# Patient Record
Sex: Female | Born: 1960 | Race: White | Hispanic: No | Marital: Married | Smoking: Never smoker
Health system: Southern US, Community
[De-identification: ages and names within clinical notes are randomized; demographics above are authoritative.]

## PROBLEM LIST (undated history)

## (undated) DIAGNOSIS — G43909 Migraine, unspecified, not intractable, without status migrainosus: Secondary | ICD-10-CM

## (undated) DIAGNOSIS — B2790 Infectious mononucleosis, unspecified without complication: Secondary | ICD-10-CM

## (undated) DIAGNOSIS — IMO0002 Reserved for concepts with insufficient information to code with codable children: Secondary | ICD-10-CM

## (undated) DIAGNOSIS — A499 Bacterial infection, unspecified: Secondary | ICD-10-CM

## (undated) DIAGNOSIS — E611 Iron deficiency: Secondary | ICD-10-CM

## (undated) DIAGNOSIS — I2510 Atherosclerotic heart disease of native coronary artery without angina pectoris: Secondary | ICD-10-CM

## (undated) DIAGNOSIS — B379 Candidiasis, unspecified: Secondary | ICD-10-CM

## (undated) DIAGNOSIS — Z8619 Personal history of other infectious and parasitic diseases: Secondary | ICD-10-CM

## (undated) DIAGNOSIS — Z79899 Other long term (current) drug therapy: Secondary | ICD-10-CM

## (undated) DIAGNOSIS — R079 Chest pain, unspecified: Secondary | ICD-10-CM

## (undated) DIAGNOSIS — I200 Unstable angina: Secondary | ICD-10-CM

## (undated) HISTORY — DX: Candidiasis, unspecified: B37.9

## (undated) HISTORY — PX: DILATION AND CURETTAGE OF UTERUS: SHX78

## (undated) HISTORY — DX: Bacterial infection, unspecified: A49.9

## (undated) HISTORY — DX: Personal history of other infectious and parasitic diseases: Z86.19

## (undated) HISTORY — DX: Other long term (current) drug therapy: Z79.899

## (undated) HISTORY — PX: TONSILLECTOMY: SUR1361

## (undated) HISTORY — DX: Migraine, unspecified, not intractable, without status migrainosus: G43.909

## (undated) HISTORY — DX: Atherosclerotic heart disease of native coronary artery without angina pectoris: I25.10

## (undated) HISTORY — DX: Reserved for concepts with insufficient information to code with codable children: IMO0002

## (undated) HISTORY — PX: WISDOM TOOTH EXTRACTION: SHX21

## (undated) HISTORY — DX: Unstable angina: I20.0

## (undated) HISTORY — DX: Infectious mononucleosis, unspecified without complication: B27.90

## (undated) HISTORY — DX: Iron deficiency: E61.1

## (undated) HISTORY — DX: Chest pain, unspecified: R07.9

## (undated) HISTORY — PX: APPENDECTOMY: SHX54

---

## 1996-09-04 DIAGNOSIS — R87619 Unspecified abnormal cytological findings in specimens from cervix uteri: Secondary | ICD-10-CM

## 1996-09-04 DIAGNOSIS — IMO0002 Reserved for concepts with insufficient information to code with codable children: Secondary | ICD-10-CM

## 1996-09-04 HISTORY — DX: Reserved for concepts with insufficient information to code with codable children: IMO0002

## 1996-09-04 HISTORY — DX: Unspecified abnormal cytological findings in specimens from cervix uteri: R87.619

## 1997-09-04 HISTORY — PX: ABDOMINAL HYSTERECTOMY: SHX81

## 2006-08-30 ENCOUNTER — Ambulatory Visit (HOSPITAL_COMMUNITY): Admission: RE | Admit: 2006-08-30 | Discharge: 2006-08-30 | Payer: Self-pay | Admitting: Obstetrics and Gynecology

## 2006-09-13 ENCOUNTER — Encounter: Admission: RE | Admit: 2006-09-13 | Discharge: 2006-09-13 | Payer: Self-pay | Admitting: Obstetrics and Gynecology

## 2007-09-03 ENCOUNTER — Ambulatory Visit (HOSPITAL_COMMUNITY): Admission: RE | Admit: 2007-09-03 | Discharge: 2007-09-03 | Payer: Self-pay | Admitting: Obstetrics and Gynecology

## 2009-01-14 ENCOUNTER — Encounter: Admission: RE | Admit: 2009-01-14 | Discharge: 2009-01-14 | Payer: Self-pay | Admitting: Obstetrics and Gynecology

## 2010-01-18 ENCOUNTER — Ambulatory Visit (HOSPITAL_COMMUNITY): Admission: RE | Admit: 2010-01-18 | Discharge: 2010-01-18 | Payer: Self-pay | Admitting: Obstetrics and Gynecology

## 2010-12-27 DIAGNOSIS — I200 Unstable angina: Secondary | ICD-10-CM

## 2010-12-27 DIAGNOSIS — R079 Chest pain, unspecified: Secondary | ICD-10-CM

## 2010-12-28 ENCOUNTER — Inpatient Hospital Stay: Admit: 2010-12-28 | Payer: Self-pay | Source: Other Acute Inpatient Hospital | Admitting: Cardiology

## 2010-12-28 ENCOUNTER — Ambulatory Visit (HOSPITAL_COMMUNITY)
Admission: AD | Admit: 2010-12-28 | Discharge: 2010-12-28 | Disposition: A | Discharge status: Home / Self Care | Payer: Managed Care, Other (non HMO) | Source: Other Acute Inpatient Hospital | Attending: Cardiovascular Disease | Admitting: Cardiovascular Disease

## 2010-12-28 DIAGNOSIS — R0789 Other chest pain: Secondary | ICD-10-CM | POA: Insufficient documentation

## 2010-12-28 DIAGNOSIS — R079 Chest pain, unspecified: Secondary | ICD-10-CM

## 2010-12-29 ENCOUNTER — Telehealth: Payer: Self-pay | Admitting: *Deleted

## 2010-12-29 DIAGNOSIS — I2510 Atherosclerotic heart disease of native coronary artery without angina pectoris: Secondary | ICD-10-CM

## 2010-12-29 HISTORY — DX: Atherosclerotic heart disease of native coronary artery without angina pectoris: I25.10

## 2011-01-02 NOTE — Telephone Encounter (Signed)
She can stop taking Aspirin

## 2011-01-03 NOTE — Telephone Encounter (Signed)
Patient informed of the below via message machine.

## 2011-01-04 NOTE — Discharge Summary (Addendum)
Valerie Waters, Valerie Waters                ACCOUNT NO.:  [id number]  MEDICAL RECORD NO.:  [id number]           PATIENT TYPE:  O  LOCATION:  [facility]                         FACILITY:  [facility]  PHYSICIAN:  Lorine Bears, MD     DATE OF BIRTH:  09/14/1960  DATE OF ADMISSION:  12/28/2010 DATE OF DISCHARGE:  12/28/2010                              DISCHARGE SUMMARY   DISCHARGE DIAGNOSES: 1. Chest pain.     a.     Third troponin came back 0.13 at [facility], unclear      significance, question coronary spasm.     b.     Normal coronary arteries by cath December 28, 2010. 2. History of migraine headaches. 3. LVEF of 60-65% with mild tricuspid regurgitation, otherwise normal     echocardiogram December 27, 2010, at [facility].  HOSPITAL COURSE:  Valerie Waters is a pleasant 50 year old female with a history of migraine headaches, who works as a Engineer, civil (consulting), who presented to [facility] with complaints of chest pain, starting about 6 days ago, left sided, aching, pressure-like sensation, which is most of the time continuous.  In between, she gets sharp chest pain, which is brief and lasts for a few seconds.  It does not get worse with activities and is not worse with any food or position.  Cardiac enzymes were negative x2, and the third set came back with a troponin of 0.13.  She was chest pain free at the time evaluation.  EKG showed normal sinus rhythm with no significant ST or T changes.  D-dimer was elevated at 1.54.  A CT of the chest was done reportedly at [facility], which was showed no evidence of pulmonary embolism.  Her chest pain was felt to be atypical; however, there was concern that women sometimes present in an atypical fashion.  Given her positive troponin, cardiac catheterization was recommended.  She was transferred to [facility] this morning for this procedure that was done by Dr. Kirke Corin which showed normal coronary arteries and normal LVEDP.  Please see above  for echo results that was previously done at [facility].  Dr. Kirke Corin felt there was unclear etiology of elevated troponin, possible coronary spasm.  I discussed this with Dr. Excell Seltzer and we have elected to discontinue her Imitrex and Treximet.  Dr. Kirke Corin has seen and examined her today and feels she is stable for discharge.  DISCHARGE LABS:  Discussed above.  Per report, CBC and BMP at [facility] were unremarkable.  STUDIES: 1. Cardiac catheterization December 28, 2010, please see full report for     details. 2. Two-D echocardiogram December 27, 2010, normal EF of 60-65% with mild     TR, trace pulmonic regurgitation, otherwise normal. 3. CTA of the chest, reportedly at [facility] showing no     evidence of pulmonary embolism.  DISCHARGE MEDICATIONS:  Amitriptyline 20 mg nightly.  Her Treximet and Imitrex have been discontinued.  DISPOSITION:  Valerie Waters will be discharged in stable condition to home. She is instructed to increase activity slowly, not to lift anything for 1 week or participate in such activity for  1 week.  She is not to drive for 2 days.  She may return to work on January 02, 2011.  She is to follow a heart-healthy diet and to call or return if she notices any pain, swelling, bleeding, or pus at the cath site.  She will follow up with Dr. Kirke Corin in approximately 4 weeks in our office.  We will call her with this appointment at [facility] office.  Please note, the patient does not have an inpatient encounter locked in the computer and therefore she was manually given a copy of discharge medication list, which included continuing her amitriptyline.  DURATION OF DISCHARGE ENCOUNTER:  Greater than 30 minutes including physician and PA time.     Ronie Spies, P.A.C.   ______________________________ Lorine Bears, MD    DD/MEDQ  D:  12/28/2010  T:  12/29/2010  Job:  [id number]  Electronically Signed by Lorine Bears MD on 01/04/2011 02:43:49 PM Electronically  Signed by Ronie Spies  on 01/05/2011 02:10:18 PM

## 2011-01-04 NOTE — Cardiovascular Report (Signed)
[name], Valerie Waters NO.:  [id number]  MEDICAL RECORD NO.:  [id number]          PATIENT TYPE:  LOCATION:                                 FACILITY:  PHYSICIAN:  Lorine Bears, MD     DATE OF BIRTH:  1961/06/22  DATE OF PROCEDURE: DATE OF DISCHARGE:                           CARDIAC CATHETERIZATION   REFERRING CARDIOLOGIST:  Learta Codding, MD,FACC.  PROCEDURES PERFORMED: 1. Left heart catheterization 2. Coronary angiography.  INDICATIONS AND CLINICAL HISTORY:  This is a 50 year old female with no previous cardiac history or risk factors for coronary artery disease. She has history of migraine headache.  She presented with 6 days history of atypical chest pain.  She was evaluated at [facility]  She underwent CT scan of her chest, which showed no evidence of pulmonary embolism.  Her initial cardiac enzymes came back negative.  However, the third troponin was 0.13, which was verified after it was repeated. Interestingly, the patient had a migraine headache early morning before her troponin was drawn, which could have been precipitated by nitroglycerin.  Due to the patient's symptoms that was suggestive of unstable angina with mildly elevated troponin level, cardiac catheterization was recommended.  Risks, benefits, and alternatives were discussed with the patient.  She did have an echocardiogram done, which overall was unremarkable with normal LV systolic function and no significant valvular abnormalities.  ACCESS:  Right radial artery.  STUDY DETAILS:  A standard informed consent was obtained.  The right radial area was prepped in a sterile fashion.  It was anesthetized with 1% lidocaine.  A 3 mg of verapamil was given through the sheath.  A 3000 units of unfractionated heparin was given intravenously.  I attempted to perform coronary angiography with a Jacky catheter, but was not able to cannulate coronary arteries due to small size aortic root.   I was able to cross the aortic valve.  We then recorded left ventricular pressure with pullback.  No left ventricular angiography was performed given that she had an echocardiogram done yesterday.  Left main coronary artery was engaged with a JL-3.5 catheter.  Right coronary artery was engaged with JR-4 catheter.  All catheter exchanges were done over the wire.  The sheath was removed at the end of the case.  A TR band was applied. There was no immediate complications.  STUDY FINDINGS:  Hemodynamic findings:  Left ventricular pressure is 92/0 with a left ventricular end-diastolic pressure of 4 mmHg.  Aortic pressure is 96/57 with a mean pressure of 74 mmHg.  No significant gradient was noted across the aortic valve.  Left ventricular angiography was not performed given that she had an echocardiogram done yesterday with normal ejection fraction.  Coronary angiography: 1. Left main coronary artery:  The vessel was normal in size and free     of significant disease. 2. Left anterior descending artery:  The vessel was normal in size and     free of significant disease.  There are three medium-sized diagonal     branches which are free of significant disease. 3. Left circumflex artery:  The vessel was normal in  size and     nondominant.  There is no significant disease noted in the vessel. 4. Right coronary artery:  The vessel is a large size and dominant.     It has no significant atherosclerosis or obstructive disease.  The     right PDA is normal in size and free of significant disease.  There     are two posterolateral branches which are free of significant     disease.  STUDY CONCLUSION: 1. Normal coronary arteries. 2. Normal LVEDP. 3. Unclear etiology for slight elevation in cardiac troponin-I.  A CT     scan was already done and showed no evidence of pulmonary embolism.     There is the possibility of coronary spasm which can be noted     sometimes in patients with migraine  headache.  RECOMMENDATIONS:  No specific intervention is needed at this time. Continue with lifestyle changes.  If there are any recurrent symptoms of chest pain, we will consider treatment with a calcium channel blocker or a long-acting nitrate.     Lorine Bears, MD     MA/MEDQ  D:  12/28/2010  T:  12/29/2010  Job:  [id number]  cc:   Learta Codding, MD,FACC  Electronically Signed by Lorine Bears MD on 01/04/2011 02:44:36 PM

## 2011-01-24 ENCOUNTER — Encounter: Payer: Managed Care, Other (non HMO) | Admitting: Cardiology

## 2011-01-24 ENCOUNTER — Encounter: Payer: Self-pay | Admitting: Cardiology

## 2011-03-02 ENCOUNTER — Other Ambulatory Visit (HOSPITAL_COMMUNITY): Payer: Self-pay | Admitting: Obstetrics and Gynecology

## 2011-03-02 DIAGNOSIS — Z1231 Encounter for screening mammogram for malignant neoplasm of breast: Secondary | ICD-10-CM

## 2011-03-15 ENCOUNTER — Ambulatory Visit (HOSPITAL_COMMUNITY)
Admission: RE | Admit: 2011-03-15 | Discharge: 2011-03-15 | Disposition: A | Discharge status: Home / Self Care | Payer: Managed Care, Other (non HMO) | Source: Ambulatory Visit | Attending: Obstetrics and Gynecology | Admitting: Obstetrics and Gynecology

## 2011-03-15 DIAGNOSIS — Z1231 Encounter for screening mammogram for malignant neoplasm of breast: Secondary | ICD-10-CM

## 2011-05-25 ENCOUNTER — Other Ambulatory Visit: Payer: Self-pay | Admitting: Chiropractic Medicine

## 2011-05-25 ENCOUNTER — Ambulatory Visit
Admission: RE | Admit: 2011-05-25 | Discharge: 2011-05-25 | Disposition: A | Discharge status: Home / Self Care | Payer: Managed Care, Other (non HMO) | Source: Ambulatory Visit | Attending: Chiropractic Medicine | Admitting: Chiropractic Medicine

## 2011-05-25 DIAGNOSIS — M5412 Radiculopathy, cervical region: Secondary | ICD-10-CM

## 2011-05-25 DIAGNOSIS — M545 Low back pain: Secondary | ICD-10-CM

## 2012-01-04 ENCOUNTER — Other Ambulatory Visit: Payer: Self-pay | Admitting: Obstetrics and Gynecology

## 2012-01-04 DIAGNOSIS — Z1231 Encounter for screening mammogram for malignant neoplasm of breast: Secondary | ICD-10-CM

## 2012-03-04 ENCOUNTER — Encounter: Payer: Self-pay | Admitting: Obstetrics and Gynecology

## 2012-03-04 ENCOUNTER — Other Ambulatory Visit: Payer: Private Health Insurance - Indemnity

## 2012-03-04 ENCOUNTER — Ambulatory Visit (INDEPENDENT_AMBULATORY_CARE_PROVIDER_SITE_OTHER): Payer: Private Health Insurance - Indemnity | Admitting: Obstetrics and Gynecology

## 2012-03-04 ENCOUNTER — Ambulatory Visit (INDEPENDENT_AMBULATORY_CARE_PROVIDER_SITE_OTHER): Payer: Private Health Insurance - Indemnity

## 2012-03-04 VITALS — BP 102/64 | Ht 65.0 in | Wt 148.0 lb

## 2012-03-04 DIAGNOSIS — Z01419 Encounter for gynecological examination (general) (routine) without abnormal findings: Secondary | ICD-10-CM

## 2012-03-04 DIAGNOSIS — M899 Disorder of bone, unspecified: Secondary | ICD-10-CM

## 2012-03-04 DIAGNOSIS — M8580 Other specified disorders of bone density and structure, unspecified site: Secondary | ICD-10-CM

## 2012-03-04 NOTE — Progress Notes (Signed)
Last Pap: 2007 WNL: Yes Regular Periods:no Contraception: Hysterectomy   Monthly Breast exam:yes Tetanus<72yrs:yes Nl.Bladder Function:yes Daily BMs:yes Healthy Diet:yes Calcium:yes Mammogram:yes Date of Mammogram: 03/2012  Exercise:yes Have often Exercise: daily  Seatbelt: yes Abuse at home: no Stressful work:no Sigmoid-colonoscopy: 2010 WNL  Bone Density:   03/04/2012    Osteopenia of spine with T score of -1.4    FRAX nl    Last Vit D on 06/03/10= 40 PCP: Dr. Lorelei Pont Change in PMH: None Change in ZOX:WRUE  Subjective:    Valerie Waters is a 51 y.o. female G4P0010 who presents for annual exam.  The patient has no complaints today.   The following portions of the patient's history were reviewed and updated as appropriate: allergies, current medications, past family history, past medical history, past social history, past surgical history and problem list.  Review of Systems Pertinent items are noted in HPI. Gastrointestinal:No change in bowel habits, no abdominal pain, no rectal bleeding Genitourinary:negative for dysuria, frequency, hematuria, nocturia and urinary incontinence    Objective:     BP 102/64  Ht 5\' 5"  (1.651 m)  Wt 148 lb (67.132 kg)  BMI 24.63 kg/m2  Weight:  Wt Readings from Last 1 Encounters:  03/04/12 148 lb (67.132 kg)     BMI: Body mass index is 24.63 kg/(m^2). General Appearance: Alert, appropriate appearance for age. No acute distress HEENT: Grossly normal Neck / Thyroid: Supple, no masses, nodes or enlargement Lungs: clear to auscultation bilaterally Back: No CVA tenderness Breast Exam: No masses or nodes.No dimpling, nipple retraction or discharge. Cardiovascular: Regular rate and rhythm. S1, S2, no murmur Gastrointestinal: Soft, non-tender, no masses or organomegaly Pelvic Exam: External genitalia: normal general appearance Vaginal: atrophic mucosa and vaginal vault, well suspended  Cervix: removed surgically Adnexa: non  palpable Uterus: removed surgically Rectovaginal: normal rectal, no masses Lymphatic Exam: Non-palpable nodes in neck, clavicular, axillary, or inguinal regions Skin: no rash or abnormalities Neurologic: Normal gait and speech, no tremor  Psychiatric: Alert and oriented, appropriate affect.    Urinalysis:Not done      Assessment:    Nl post hysterectomy exam  Osteopenia with nl FRAX and good bone maintenance regimen   Plan:    Discussed healthy lifestyle modifications. May consider rpt DXA in 2 or more years   Follow-up:  for annual exam

## 2012-03-20 ENCOUNTER — Ambulatory Visit (HOSPITAL_COMMUNITY)
Admission: RE | Admit: 2012-03-20 | Discharge: 2012-03-20 | Disposition: A | Discharge status: Home / Self Care | Payer: Managed Care, Other (non HMO) | Source: Ambulatory Visit | Attending: Obstetrics and Gynecology | Admitting: Obstetrics and Gynecology

## 2012-03-20 DIAGNOSIS — Z1231 Encounter for screening mammogram for malignant neoplasm of breast: Secondary | ICD-10-CM

## 2012-03-28 ENCOUNTER — Encounter: Payer: Self-pay | Admitting: Obstetrics and Gynecology

## 2013-02-11 ENCOUNTER — Other Ambulatory Visit: Payer: Self-pay | Admitting: Obstetrics and Gynecology

## 2013-02-11 DIAGNOSIS — Z1231 Encounter for screening mammogram for malignant neoplasm of breast: Secondary | ICD-10-CM

## 2013-02-24 ENCOUNTER — Ambulatory Visit (HOSPITAL_COMMUNITY)
Admission: RE | Admit: 2013-02-24 | Discharge: 2013-02-24 | Disposition: A | Discharge status: Home / Self Care | Payer: Managed Care, Other (non HMO) | Source: Ambulatory Visit | Attending: Obstetrics and Gynecology | Admitting: Obstetrics and Gynecology

## 2013-02-24 DIAGNOSIS — Z1231 Encounter for screening mammogram for malignant neoplasm of breast: Secondary | ICD-10-CM | POA: Insufficient documentation

## 2014-01-29 ENCOUNTER — Other Ambulatory Visit: Payer: Self-pay | Admitting: Obstetrics and Gynecology

## 2014-01-29 DIAGNOSIS — Z1231 Encounter for screening mammogram for malignant neoplasm of breast: Secondary | ICD-10-CM

## 2014-02-16 ENCOUNTER — Ambulatory Visit (HOSPITAL_COMMUNITY)
Admission: RE | Admit: 2014-02-16 | Discharge: 2014-02-16 | Disposition: A | Discharge status: Home / Self Care | Payer: Managed Care, Other (non HMO) | Source: Ambulatory Visit | Attending: Obstetrics and Gynecology | Admitting: Obstetrics and Gynecology

## 2014-02-16 DIAGNOSIS — Z1231 Encounter for screening mammogram for malignant neoplasm of breast: Secondary | ICD-10-CM | POA: Insufficient documentation

## 2014-07-06 ENCOUNTER — Encounter: Payer: Self-pay | Admitting: Obstetrics and Gynecology

## 2015-05-30 ENCOUNTER — Encounter (HOSPITAL_COMMUNITY): Payer: Self-pay | Admitting: Emergency Medicine

## 2015-05-30 ENCOUNTER — Emergency Department (HOSPITAL_COMMUNITY): Payer: Managed Care, Other (non HMO)

## 2015-05-30 ENCOUNTER — Emergency Department (HOSPITAL_COMMUNITY)
Admission: EM | Admit: 2015-05-30 | Discharge: 2015-05-31 | Disposition: A | Discharge status: Home / Self Care | Payer: Managed Care, Other (non HMO) | Attending: Emergency Medicine | Admitting: Emergency Medicine

## 2015-05-30 DIAGNOSIS — R509 Fever, unspecified: Secondary | ICD-10-CM | POA: Diagnosis present

## 2015-05-30 DIAGNOSIS — R059 Cough, unspecified: Secondary | ICD-10-CM

## 2015-05-30 DIAGNOSIS — B349 Viral infection, unspecified: Secondary | ICD-10-CM | POA: Insufficient documentation

## 2015-05-30 DIAGNOSIS — R05 Cough: Secondary | ICD-10-CM

## 2015-05-30 DIAGNOSIS — Z8619 Personal history of other infectious and parasitic diseases: Secondary | ICD-10-CM | POA: Insufficient documentation

## 2015-05-30 DIAGNOSIS — I2510 Atherosclerotic heart disease of native coronary artery without angina pectoris: Secondary | ICD-10-CM | POA: Diagnosis not present

## 2015-05-30 LAB — I-STAT TROPONIN, ED: TROPONIN I, POC: 0 ng/mL (ref 0.00–0.08)

## 2015-05-30 LAB — CBC
HCT: 41 % (ref 36.0–46.0)
Hemoglobin: 13.7 g/dL (ref 12.0–15.0)
MCH: 29.4 pg (ref 26.0–34.0)
MCHC: 33.4 g/dL (ref 30.0–36.0)
MCV: 88 fL (ref 78.0–100.0)
PLATELETS: 192 10*3/uL (ref 150–400)
RBC: 4.66 MIL/uL (ref 3.87–5.11)
RDW: 13.2 % (ref 11.5–15.5)
WBC: 2.7 10*3/uL — AB (ref 4.0–10.5)

## 2015-05-30 LAB — BASIC METABOLIC PANEL
Anion gap: 8 (ref 5–15)
BUN: 14 mg/dL (ref 6–20)
CO2: 24 mmol/L (ref 22–32)
CREATININE: 0.85 mg/dL (ref 0.44–1.00)
Calcium: 9.4 mg/dL (ref 8.9–10.3)
Chloride: 104 mmol/L (ref 101–111)
Glucose, Bld: 148 mg/dL — ABNORMAL HIGH (ref 65–99)
Potassium: 3.7 mmol/L (ref 3.5–5.1)
SODIUM: 136 mmol/L (ref 135–145)

## 2015-05-30 LAB — I-STAT CG4 LACTIC ACID, ED: LACTIC ACID, VENOUS: 1.19 mmol/L (ref 0.5–2.0)

## 2015-05-30 MED ORDER — METOCLOPRAMIDE HCL 5 MG/ML IJ SOLN
10.0000 mg | Freq: Once | INTRAMUSCULAR | Status: AC
  Administered 2015-05-30: 10 mg via INTRAVENOUS
  Filled 2015-05-30: qty 2

## 2015-05-30 MED ORDER — ACETAMINOPHEN 325 MG PO TABS
650.0000 mg | ORAL_TABLET | Freq: Once | ORAL | Status: AC
  Administered 2015-05-30: 650 mg via ORAL
  Filled 2015-05-30: qty 2

## 2015-05-30 MED ORDER — DOXYCYCLINE HYCLATE 100 MG PO CAPS: 100.0000 mg | ORAL_CAPSULE | Freq: Two times a day (BID) | ORAL | Status: DC

## 2015-05-30 MED ORDER — SODIUM CHLORIDE 0.9 % IV BOLUS (SEPSIS)
1000.0000 mL | Freq: Once | INTRAVENOUS | Status: AC
  Administered 2015-05-30: 1000 mL via INTRAVENOUS

## 2015-05-30 MED ORDER — DOXYCYCLINE HYCLATE 100 MG PO TABS
100.0000 mg | ORAL_TABLET | Freq: Once | ORAL | Status: AC
  Administered 2015-05-30: 100 mg via ORAL
  Filled 2015-05-30: qty 1

## 2015-05-30 MED ORDER — DIPHENHYDRAMINE HCL 50 MG/ML IJ SOLN
25.0000 mg | Freq: Once | INTRAMUSCULAR | Status: AC
  Administered 2015-05-30: 25 mg via INTRAVENOUS
  Filled 2015-05-30: qty 1

## 2015-05-30 NOTE — ED Notes (Signed)
Feeling better.

## 2015-05-30 NOTE — ED Notes (Signed)
The pt  Has multiple complaints.   Chest pain for 10 days sob since Friday.  None now dry cough. Fever chills since Saturday  Elevated temp today with a headache.  Insect bite rt forearm with redness  2 days ago  Small red area rt forearm  Appears to be a bug bite.  Tem today noi fever reducer taken

## 2015-05-30 NOTE — ED Provider Notes (Signed)
CSN: [id number]     Arrival date & time 05/30/15  1944 History   First MD Initiated Contact with Patient 05/30/15 2044     Chief Complaint  Patient presents with  . Fever  . Cough  . Chest Pain     (Consider location/radiation/quality/duration/timing/severity/associated sxs/prior Treatment) Patient is a 54 y.o. female presenting with cough.  Cough Cough characteristics:  Non-productive Severity:  Moderate Onset quality:  Gradual Duration:  2 days Timing:  Constant Progression:  Unchanged Chronicity:  New Smoker: no   Context comment:  Travelled via plane to [facility] Relieved by:  Nothing Worsened by:  Nothing tried Associated symptoms: fever (tmax 102), myalgias and sinus congestion   Associated symptoms: no shortness of breath     Past Medical History  Diagnosis Date  . Chest pain, unspecified   . Coronary atherosclerosis of native coronary artery 12/29/2010       CARDIAC CATHETERIZATION   . Intermediate coronary syndrome   . Encounter for long-term (current) use of other medications   . H/O varicella   . Infectious mononucleosis   . Abnormal Pap smear 1998  . Yeast infection   . Bacterial infection   . Low iron   . Migraine, unspecified, without mention of intractable migraine without mention of status migrainosus     History of   Past Surgical History  Procedure Laterality Date  . Wisdom tooth extraction    . Tonsillectomy    . Appendectomy    . Dilation and curettage of uterus    . Abdominal hysterectomy  1999   Family History  Problem Relation Age of Onset  . Cancer Mother   . Mental illness Brother    Social History  Substance Use Topics  . Smoking status: Never Smoker   . Smokeless tobacco: Never Used  . Alcohol Use: No   OB History    Gravida Para Term Preterm AB TAB SAB Ectopic Multiple Living   Review of Systems  Constitutional: Positive for fever (tmax 102).  Respiratory: Positive for cough. Negative for shortness of  breath.   Musculoskeletal: Positive for myalgias.  All other systems reviewed and are negative.     Allergies  Triptans; Zomig; Codeine; and Demerol  Home Medications   Prior to Admission medications   Medication Sig Start Date End Date Taking? Authorizing Provider  doxycycline (VIBRAMYCIN) 100 MG capsule Take 1 capsule (100 mg total) by mouth 2 (two) times daily. One po bid x 20 days 05/30/15   Mirian Mo, MD   BP 118/74 mmHg  Pulse 77  Temp(Src) 98.7 F (37.1 C) (Oral)  Resp 16  Ht  (1.626 m)  Wt 134 lb (60.782 kg)  BMI 22.99 kg/m2  SpO2 97% Physical Exam  Constitutional: She is oriented to person, place, and time. She appears well-developed and well-nourished.  HENT:  Head: Normocephalic and atraumatic.  Right Ear: External ear normal.  Left Ear: External ear normal.  Eyes: Conjunctivae and EOM are normal. Pupils are equal, round, and reactive to light.  Neck: Normal range of motion. Neck supple.  Cardiovascular: Normal rate, regular rhythm, normal heart sounds and intact distal pulses.   Pulmonary/Chest: Effort normal and breath sounds normal.  Abdominal: Soft. Bowel sounds are normal. There is generalized tenderness (mild).  Musculoskeletal: Normal range of motion.  Neurological: She is alert and oriented to person, place, and time.  Skin: Skin is warm and dry.  Vitals reviewed.   ED Course  Procedures (including critical care time) Labs Review Labs Reviewed  BASIC METABOLIC PANEL - Abnormal; Notable for the following:    Glucose, Bld 148 (*)    All other components within normal limits  CBC - Abnormal; Notable for the following:    WBC 2.7 (*)    All other components within normal limits  URINE CULTURE  URINALYSIS, ROUTINE W REFLEX MICROSCOPIC (NOT AT [facility])  I-STAT CG4 LACTIC ACID, ED  Rosezena Sensor, ED    Imaging Review Dg Chest 2 View  05/30/2015   CLINICAL DATA:  Fever for 2 days with cough, chest pain and headache.  EXAM: CHEST  2 VIEW   COMPARISON:  Single view of the chest and CT chest 12/26/2010.  FINDINGS: Heart size and mediastinal contours are within normal limits. Both lungs are clear. Visualized skeletal structures are unremarkable.  IMPRESSION: Negative exam.   Electronically Signed   By: Drusilla Kanner M.D.   On: 05/30/2015 20:47   I have personally reviewed and evaluated these images and lab results as part of my medical decision-making.   EKG Interpretation   Date/Time:  Sunday May 30 2015 19:49:20 EDT Ventricular Rate:  102 PR Interval:  130 QRS Duration: 88 QT Interval:  422 QTC Calculation: 550 R Axis:   86 Text Interpretation:  Sinus tachycardia Low voltage QRS Nonspecific T wave  abnormality Prolonged QT Abnormal ECG No old tracing to compare Confirmed  by Mirian Mo [phone]) on 05/30/2015 9:04:52 PM      MDM   Final diagnoses:  Viral syndrome  Cough    54 y.o. female with pertinent PMH of cad presents with cough, fever, myalgias in setting of recent airline travel.  Pt has been using essential oils at home without relief.  On arrival today vitals signs and physical exam as above. No significant chest pain.  Patient afebrile initially, however then developed a fever to 100.7.  She is also concerned about being bit by a tick and questionably by black widow. She did not see a spider bite her, however cleared "10" likely is out of the arm. She's had no abdominal pain. She has diffuse myalgias. Workup as above demonstrated no acute pathology with the exception of a mild leukopenia.  Likely viral syndrome, however due to recent tick bite will treat empirically for tickborne illness with doxycycline.  DC home in stable condition.    I have reviewed all laboratory and imaging studies if ordered as above  1. Viral syndrome   2. Cough         Mirian Mo, MD 05/31/15 [phone]

## 2015-05-30 NOTE — Discharge Instructions (Signed)
Upper Respiratory Infection, Adult An upper respiratory infection (URI) is also sometimes known as the common cold. The upper respiratory tract includes the nose, sinuses, throat, trachea, and bronchi. Bronchi are the airways leading to the lungs. Most people improve within 1 week, but symptoms can last up to 2 weeks. A residual cough may last even longer.  CAUSES Many different viruses can infect the tissues lining the upper respiratory tract. The tissues become irritated and inflamed and often become very moist. Mucus production is also common. A cold is contagious. You can easily spread the virus to others by oral contact. This includes kissing, sharing a glass, coughing, or sneezing. Touching your mouth or nose and then touching a surface, which is then touched by another person, can also spread the virus. SYMPTOMS  Symptoms typically develop 1 to 3 days after you come in contact with a cold virus. Symptoms vary from person to person. They may include:  Runny nose.  Sneezing.  Nasal congestion.  Sinus irritation.  Sore throat.  Loss of voice (laryngitis).  Cough.  Fatigue.  Muscle aches.  Loss of appetite.  Headache.  Low-grade fever. DIAGNOSIS  You might diagnose your own cold based on familiar symptoms, since most people get a cold 2 to 3 times a year. Your caregiver can confirm this based on your exam. Most importantly, your caregiver can check that your symptoms are not due to another disease such as strep throat, sinusitis, pneumonia, asthma, or epiglottitis. Blood tests, throat tests, and X-rays are not necessary to diagnose a common cold, but they may sometimes be helpful in excluding other more serious diseases. Your caregiver will decide if any further tests are required. RISKS AND COMPLICATIONS  You may be at risk for a more severe case of the common cold if you smoke cigarettes, have chronic heart disease (such as heart failure) or lung disease (such as asthma), or if  you have a weakened immune system. The very young and very old are also at risk for more serious infections. Bacterial sinusitis, middle ear infections, and bacterial pneumonia can complicate the common cold. The common cold can worsen asthma and chronic obstructive pulmonary disease (COPD). Sometimes, these complications can require emergency medical care and may be life-threatening. PREVENTION  The best way to protect against getting a cold is to practice good hygiene. Avoid oral or hand contact with people with cold symptoms. Wash your hands often if contact occurs. There is no clear evidence that vitamin C, vitamin E, echinacea, or exercise reduces the chance of developing a cold. However, it is always recommended to get plenty of rest and practice good nutrition. TREATMENT  Treatment is directed at relieving symptoms. There is no cure. Antibiotics are not effective, because the infection is caused by a virus, not by bacteria. Treatment may include:  Increased fluid intake. Sports drinks offer valuable electrolytes, sugars, and fluids.  Breathing heated mist or steam (vaporizer or shower).  Eating chicken soup or other clear broths, and maintaining good nutrition.  Getting plenty of rest.  Using gargles or lozenges for comfort.  Controlling fevers with ibuprofen or acetaminophen as directed by your caregiver.  Increasing usage of your inhaler if you have asthma. Zinc gel and zinc lozenges, taken in the first 24 hours of the common cold, can shorten the duration and lessen the severity of symptoms. Pain medicines may help with fever, muscle aches, and throat pain. A variety of non-prescription medicines are available to treat congestion and runny nose. Your caregiver   can make recommendations and may suggest nasal or lung inhalers for other symptoms.  HOME CARE INSTRUCTIONS   Only take over-the-counter or prescription medicines for pain, discomfort, or fever as directed by your  caregiver.  Use a warm mist humidifier or inhale steam from a shower to increase air moisture. This may keep secretions moist and make it easier to breathe.  Drink enough water and fluids to keep your urine clear or pale yellow.  Rest as needed.  Return to work when your temperature has returned to normal or as your caregiver advises. You may need to stay home longer to avoid infecting others. You can also use a face mask and careful hand washing to prevent spread of the virus. SEEK MEDICAL CARE IF:   After the first few days, you feel you are getting worse rather than better.  You need your caregiver's advice about medicines to control symptoms.  You develop chills, worsening shortness of breath, or brown or red sputum. These may be signs of pneumonia.  You develop yellow or brown nasal discharge or pain in the face, especially when you bend forward. These may be signs of sinusitis.  You develop a fever, swollen neck glands, pain with swallowing, or white areas in the back of your throat. These may be signs of strep throat. SEEK IMMEDIATE MEDICAL CARE IF:   You have a fever.  You develop severe or persistent headache, ear pain, sinus pain, or chest pain.  You develop wheezing, a prolonged cough, cough up blood, or have a change in your usual mucus (if you have chronic lung disease).  You develop sore muscles or a stiff neck. Document Released: 02/14/2001 Document Revised: 11/13/2011 Document Reviewed: 11/26/2013 Exit[employer] Patient Information 2015 [employer]. This information is not intended to replace advice given to you by your health care provider. Make sure you discuss any questions you have with your health care provider.  

## 2015-05-30 NOTE — ED Notes (Signed)
Patient here with multiple complaints. In endorses fever above 102 at home x2 days, shortness of breath, left upper chest pain, malaise, cough, and headache. Also reports that she was bitten by a spider of some variety a couple days ago and points to mark on right forearm; area appears mildly discolored, not swollen, not tender. Also states 2 recent tick bites. BP low in triage coupled with tachycardia. Temperature currently 99.2; states temperature was 101 at home prior to coming to hospital. Denies use of any home medications. States she only uses essential oils.

## 2016-04-10 ENCOUNTER — Ambulatory Visit (HOSPITAL_COMMUNITY)
Admission: EM | Admit: 2016-04-10 | Discharge: 2016-04-10 | Disposition: A | Discharge status: Home / Self Care | Payer: Managed Care, Other (non HMO) | Attending: Emergency Medicine | Admitting: Emergency Medicine

## 2016-04-10 ENCOUNTER — Ambulatory Visit (INDEPENDENT_AMBULATORY_CARE_PROVIDER_SITE_OTHER): Payer: Managed Care, Other (non HMO)

## 2016-04-10 ENCOUNTER — Encounter (HOSPITAL_COMMUNITY): Payer: Self-pay | Admitting: Emergency Medicine

## 2016-04-10 DIAGNOSIS — R05 Cough: Secondary | ICD-10-CM

## 2016-04-10 DIAGNOSIS — R0982 Postnasal drip: Secondary | ICD-10-CM

## 2016-04-10 DIAGNOSIS — J9801 Acute bronchospasm: Secondary | ICD-10-CM

## 2016-04-10 DIAGNOSIS — J40 Bronchitis, not specified as acute or chronic: Secondary | ICD-10-CM

## 2016-04-10 DIAGNOSIS — R059 Cough, unspecified: Secondary | ICD-10-CM

## 2016-04-10 MED ORDER — IPRATROPIUM-ALBUTEROL 0.5-2.5 (3) MG/3ML IN SOLN
3.0000 mL | Freq: Once | RESPIRATORY_TRACT | Status: AC
  Administered 2016-04-10: 3 mL via RESPIRATORY_TRACT

## 2016-04-10 MED ORDER — AZITHROMYCIN 250 MG PO TABS: ORAL_TABLET | ORAL | 0 refills | Status: AC

## 2016-04-10 MED ORDER — IPRATROPIUM-ALBUTEROL 0.5-2.5 (3) MG/3ML IN SOLN
RESPIRATORY_TRACT | Status: AC
  Filled 2016-04-10: qty 3

## 2016-04-10 MED ORDER — PREDNISONE 20 MG PO TABS: ORAL_TABLET | ORAL | 0 refills | Status: AC

## 2016-04-10 MED ORDER — ALBUTEROL SULFATE HFA 108 (90 BASE) MCG/ACT IN AERS: 2.0000 | INHALATION_SPRAY | RESPIRATORY_TRACT | 0 refills | Status: AC | PRN

## 2016-04-10 NOTE — Discharge Instructions (Signed)
For nasal and head congestion may take Sudafed PE 10 mg every 4 hours as needed. °Saline nasal spray used frequently. °For drainage may use Allegra, Claritin or Zyrtec. If you need stronger medicine to stop drainage may take Chlor-Trimeton 2-4 mg every 4 hours. This may cause drowsiness. °Ibuprofen 600 mg every 6 hours as needed for pain, discomfort or fever. °Drink plenty of fluids and stay well-hydrated. °

## 2016-04-10 NOTE — ED Provider Notes (Signed)
CSN: [id number]     Arrival date & time 04/10/16  1026 History   First MD Initiated Contact with Patient 04/10/16 1103     Chief Complaint  Patient presents with  . Cough   (Consider location/radiation/quality/duration/timing/severity/associated sxs/prior Treatment) 55 year old female complaining of malaise and weakness over the past couple weeks. Over the past 2-3 days she has developed shortness of breath at rest and worse with exertion associated with cough and call spasms. Over the past couple of nights she has had a "low-grade fever" mentioning 100.5 has one reading. Her cough is loose and harsh. Symptoms are associated include sore throat and headache. States she has never and does not currently smoke. Denies known cardiopulmonary disease.      Past Medical History:  Diagnosis Date  . Abnormal Pap smear 1998  . Bacterial infection   . Chest pain, unspecified   . Coronary atherosclerosis of native coronary artery 12/29/2010      CARDIAC CATHETERIZATION   . Encounter for long-term (current) use of other medications   . H/O varicella   . Infectious mononucleosis   . Intermediate coronary syndrome (HCC)   . Low iron   . Migraine, unspecified, without mention of intractable migraine without mention of status migrainosus    History of  . Yeast infection    Past Surgical History:  Procedure Laterality Date  . ABDOMINAL HYSTERECTOMY  1999  . APPENDECTOMY    . DILATION AND CURETTAGE OF UTERUS    . TONSILLECTOMY    . WISDOM TOOTH EXTRACTION     Family History  Problem Relation Age of Onset  . Cancer Mother   . Mental illness Brother    Social History  Substance Use Topics  . Smoking status: Never Smoker  . Smokeless tobacco: Never Used  . Alcohol use No   OB History    Gravida Para Term Preterm AB Living   SAB TAB Ectopic Multiple Live Births   1             Review of Systems  Constitutional: Positive for activity change, fatigue and fever.  HENT:  Positive for congestion, postnasal drip and sore throat.   Eyes: Negative.   Respiratory: Positive for cough, shortness of breath and wheezing.   Cardiovascular: Negative for chest pain and leg swelling.  Gastrointestinal: Negative.   Musculoskeletal: Negative.   Skin: Negative.   Neurological: Negative.   All other systems reviewed and are negative.   Allergies  Triptans; Zomig [zolmitriptan]; Codeine; and Demerol [meperidine]  Home Medications   Prior to Admission medications   Medication Sig Start Date End Date Taking? Authorizing Provider  albuterol (PROVENTIL HFA;VENTOLIN HFA) 108 (90 Base) MCG/ACT inhaler Inhale 2 puffs into the lungs every 4 (four) hours as needed for wheezing or shortness of breath. 04/10/16   Hayden Rasmussen, NP  azithromycin (ZITHROMAX) 250 MG tablet 2 tabs po on day one, then one tablet po once daily on days 2-5. 04/10/16   Hayden Rasmussen, NP  predniSONE (DELTASONE) 20 MG tablet Take 3 tabs po on first day, 2 tabs second day, 2 tabs third day, 1 tab fourth day, 1 tab 5th day. Take with food. 04/10/16   Hayden Rasmussen, NP   Meds Ordered and Administered this Visit   Medications  ipratropium-albuterol (DUONEB) 0.5-2.5 (3) MG/3ML nebulizer solution 3 mL (3 mLs Nebulization Given 04/10/16 1200)    BP 112/70 (BP Location: Right Arm)   Pulse 87  Temp 97.7 F (36.5 C) (Oral)   Resp 20   SpO2 95%  No data found.   Physical Exam  Constitutional: She is oriented to person, place, and time. She appears well-developed and well-nourished. No distress.  HENT:  Head: Normocephalic and atraumatic.  Bilateral TMs are normal. Oropharynx with minor uniform erythema and mild amount of clear PND. No exudates or swelling.  Eyes: EOM are normal.  Neck: Normal range of motion. Neck supple.  Cardiovascular: Normal rate, regular rhythm and normal heart sounds.   Pulmonary/Chest: Effort normal. No respiratory distress.  Fair to good air movement. Forced expiration produces coughing  spasms. Wear wheeze Cough is loose and coarse throughout. No adventitious sounds in the bases.  Musculoskeletal: Normal range of motion. She exhibits no edema.  Neurological: She is alert and oriented to person, place, and time.  Skin: Skin is warm and dry. No rash noted.  Psychiatric: She has a normal mood and affect.  Nursing note and vitals reviewed.   Urgent Care Course   Clinical Course  Value Comment By Time  DG Chest 2 View (Reviewed) Hayden Rasmussen, NP 08/07 1224  DG Chest 2 View (Reviewed) Hayden Rasmussen, NP 08/07 1225    Procedures (including critical care time)  Labs Review Labs Reviewed - No data to display  Imaging Review Dg Chest 2 View  Result Date: 04/10/2016 CLINICAL DATA:  Cough. Shortness of breath. Dizziness. Fever and sweats at night. EXAM: CHEST  2 VIEW COMPARISON:  05/30/2015. FINDINGS: Normal sized heart. Clear lungs. Mild central peribronchial thickening. Mild thoracic spine degenerative changes. IMPRESSION: Interval mild bronchitic changes. Electronically Signed   By: Beckie Salts M.D.   On: 04/10/2016 12:42     Visual Acuity Review  Right Eye Distance:   Left Eye Distance:   Bilateral Distance:    Right Eye Near:   Left Eye Near:    Bilateral Near:         MDM   1. Bronchitis   2. Bronchospasm, acute   3. Cough   4. PND (post-nasal drip)    Meds ordered this encounter  Medications  . ipratropium-albuterol (DUONEB) 0.5-2.5 (3) MG/3ML nebulizer solution 3 mL  . azithromycin (ZITHROMAX) 250 MG tablet    Sig: 2 tabs po on day one, then one tablet po once daily on days 2-5.    Dispense:  6 tablet    Refill:  0    Order Specific Question:   Supervising Provider    Answer:   Charm Rings [id number]  . albuterol (PROVENTIL HFA;VENTOLIN HFA) 108 (90 Base) MCG/ACT inhaler    Sig: Inhale 2 puffs into the lungs every 4 (four) hours as needed for wheezing or shortness of breath.    Dispense:  1 Inhaler    Refill:  0    Order Specific Question:    Supervising Provider    Answer:   Charm Rings [id number]  . predniSONE (DELTASONE) 20 MG tablet    Sig: Take 3 tabs po on first day, 2 tabs second day, 2 tabs third day, 1 tab fourth day, 1 tab 5th day. Take with food.    Dispense:  9 tablet    Refill:  0    Order Specific Question:   Supervising Provider    Answer:   Charm Rings [id number]   Patient states she received modest improvement after the DuoNeb. Auscultation reveals dramatic improvement in air movement and decrease in wheeze and cough. Cough remains loose under auscultation however  no wheezing or rhonchi. For nasal and head congestion may take Sudafed PE 10 mg every 4 hours as needed. Saline nasal spray used frequently. For drainage may use Allegra, Claritin or Zyrtec. If you need stronger medicine to stop drainage may take Chlor-Trimeton 2-4 mg every 4 hours. This may cause drowsiness. Ibuprofen 600 mg every 6 hours as needed for pain, discomfort or fever. Drink plenty of fluids and stay well-hydrated.     Hayden Rasmussenavid Rabecca Birge, NP 04/10/16 1316

## 2016-04-10 NOTE — ED Triage Notes (Signed)
[facility] a [facility] of a cough, shortness of breath, and a fever last night that has been ongoing for 2 weeks and gotten worse over the last 48 hours.

## 2016-04-17 ENCOUNTER — Other Ambulatory Visit: Payer: Self-pay | Admitting: Obstetrics and Gynecology

## 2016-04-17 DIAGNOSIS — R928 Other abnormal and inconclusive findings on diagnostic imaging of breast: Secondary | ICD-10-CM

## 2016-04-19 ENCOUNTER — Other Ambulatory Visit: Payer: Managed Care, Other (non HMO)

## 2016-04-20 ENCOUNTER — Ambulatory Visit
Admission: RE | Admit: 2016-04-20 | Discharge: 2016-04-20 | Disposition: A | Discharge status: Home / Self Care | Payer: Managed Care, Other (non HMO) | Source: Ambulatory Visit | Attending: Obstetrics and Gynecology | Admitting: Obstetrics and Gynecology

## 2016-04-20 DIAGNOSIS — R928 Other abnormal and inconclusive findings on diagnostic imaging of breast: Secondary | ICD-10-CM

## 2017-03-20 IMAGING — DX DG CHEST 2V
2 series · 2 of 2 positions shown · non-contrast
Comparison: 05/30/2015.

CLINICAL DATA: Cough. Shortness of breath. Dizziness. Fever and
sweats at night.

EXAM:
CHEST  2 VIEW

[chest ap]
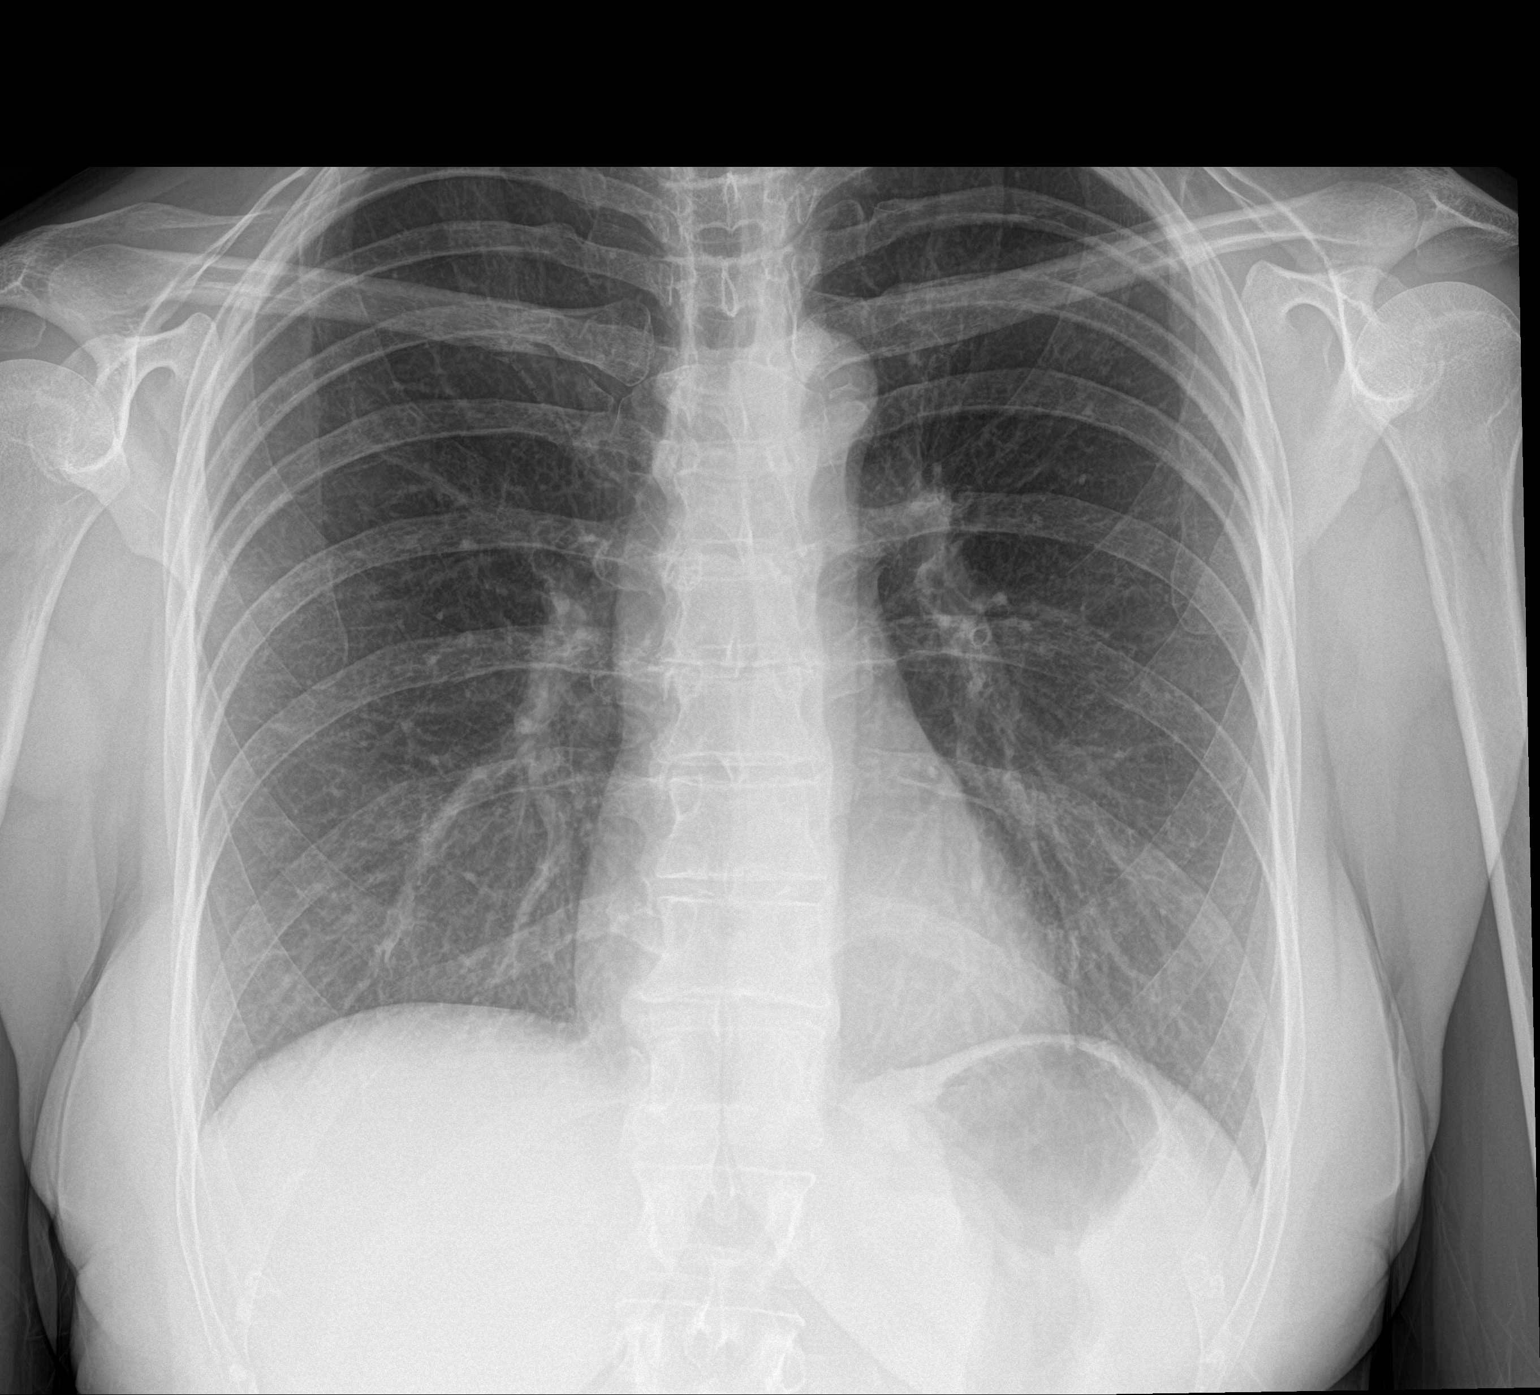

[chest lat]
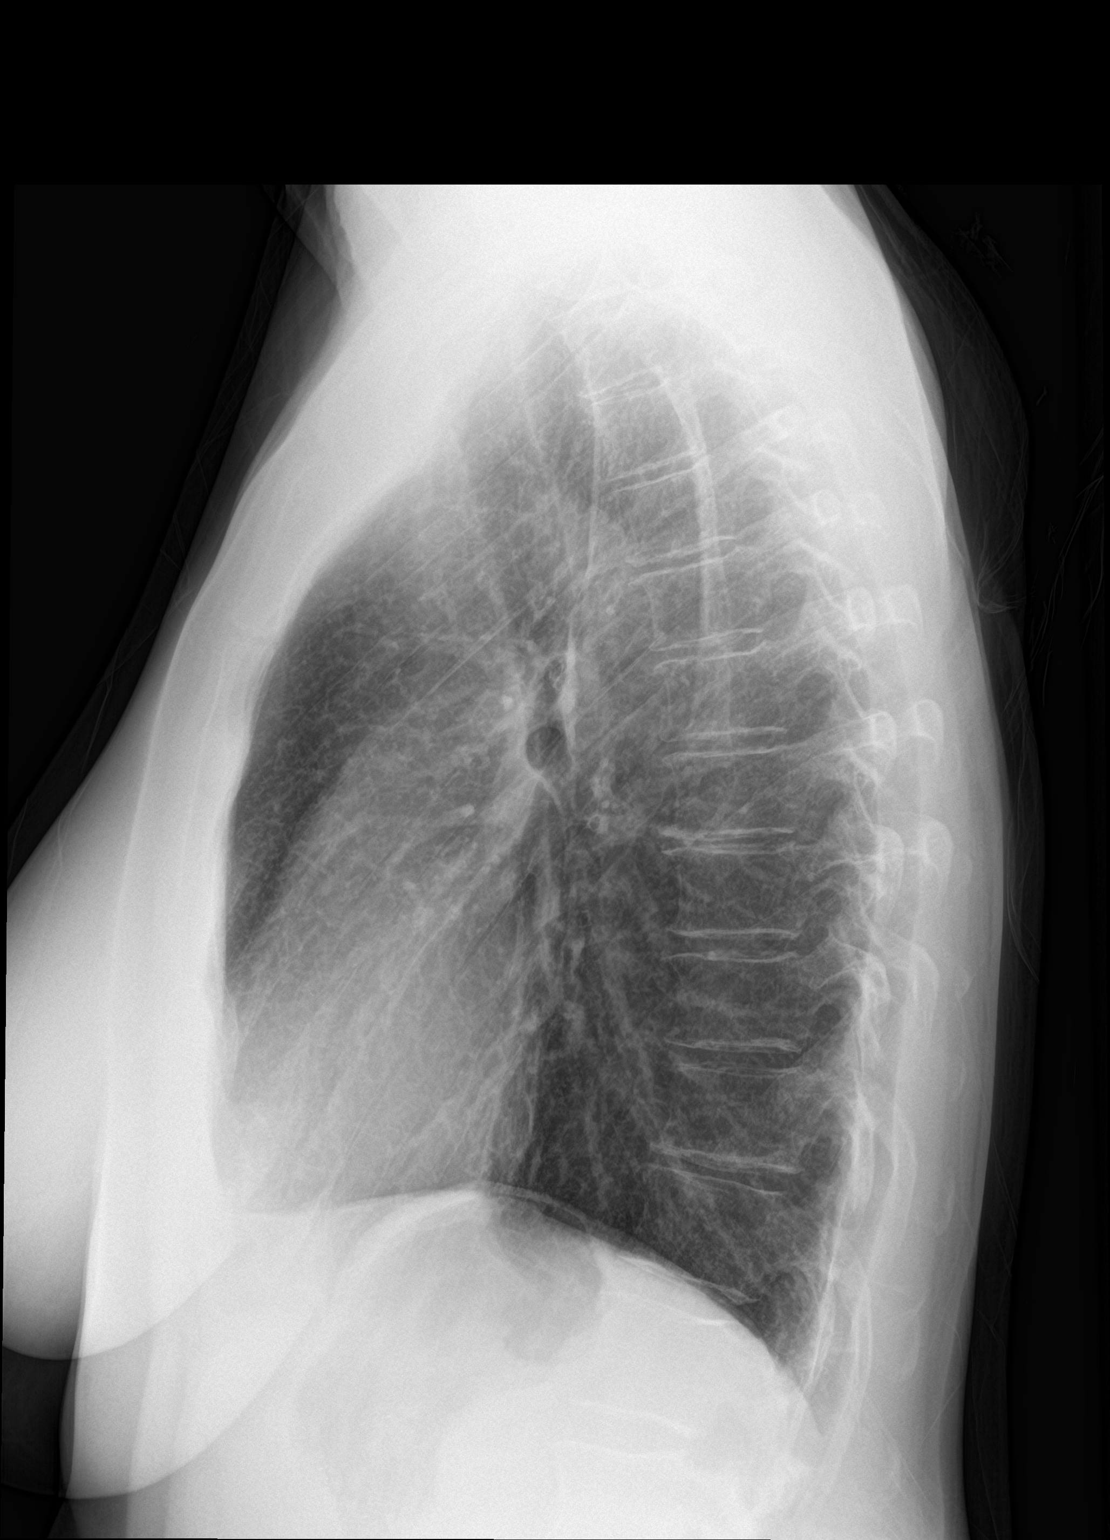

[2 of 2 positions shown; findings below may reference images not displayed]

FINDINGS: Normal sized heart. Clear lungs. Mild central peribronchial
thickening. Mild thoracic spine degenerative changes.
IMPRESSION: Interval mild bronchitic changes.
